# Patient Record
Sex: Male | Born: 1970 | Race: Black or African American | Hispanic: No | Marital: Married | Smoking: Never smoker
Health system: Southern US, Community
[De-identification: ages and names within clinical notes are randomized; demographics above are authoritative.]

## PROBLEM LIST (undated history)

## (undated) DIAGNOSIS — E785 Hyperlipidemia, unspecified: Secondary | ICD-10-CM

## (undated) HISTORY — DX: Hyperlipidemia, unspecified: E78.5

---

## 1998-06-04 ENCOUNTER — Emergency Department (HOSPITAL_COMMUNITY): Admission: EM | Admit: 1998-06-04 | Discharge: 1998-06-04 | Payer: Self-pay | Admitting: Emergency Medicine

## 2013-02-17 ENCOUNTER — Other Ambulatory Visit: Payer: Self-pay | Admitting: Family Medicine

## 2013-02-17 ENCOUNTER — Ambulatory Visit (INDEPENDENT_AMBULATORY_CARE_PROVIDER_SITE_OTHER): Payer: 59 | Admitting: Family Medicine

## 2013-02-17 ENCOUNTER — Ambulatory Visit: Payer: 59

## 2013-02-17 VITALS — BP 136/80 | HR 84 | Temp 98.0°F | Resp 18 | Ht 66.5 in | Wt 172.0 lb

## 2013-02-17 DIAGNOSIS — M419 Scoliosis, unspecified: Secondary | ICD-10-CM

## 2013-02-17 DIAGNOSIS — E785 Hyperlipidemia, unspecified: Secondary | ICD-10-CM

## 2013-02-17 DIAGNOSIS — M549 Dorsalgia, unspecified: Secondary | ICD-10-CM

## 2013-02-17 DIAGNOSIS — M4120 Other idiopathic scoliosis, site unspecified: Secondary | ICD-10-CM

## 2013-02-17 LAB — LIPID PANEL
Cholesterol: 234 mg/dL — ABNORMAL HIGH (ref 0–200)
HDL: 46 mg/dL (ref >39)
LDL Cholesterol: 173 mg/dL — ABNORMAL HIGH (ref 0–99)
Total CHOL/HDL Ratio: 5.1 Ratio
Triglycerides: 73 mg/dL (ref <150)
VLDL: 15 mg/dL (ref 0–40)

## 2013-02-17 LAB — TSH: TSH: 0.037 u[IU]/mL — ABNORMAL LOW (ref 0.350–4.500)

## 2013-02-17 MED ORDER — MELOXICAM 7.5 MG PO TABS: 7.5000 mg | ORAL_TABLET | Freq: Two times a day (BID) | ORAL | Status: DC | PRN

## 2013-02-17 MED ORDER — METHOCARBAMOL 500 MG PO TABS: 500.0000 mg | ORAL_TABLET | Freq: Three times a day (TID) | ORAL | Status: DC | PRN

## 2013-02-17 MED ORDER — TRAMADOL HCL 50 MG PO TABS: 50.0000 mg | ORAL_TABLET | Freq: Three times a day (TID) | ORAL | Status: DC | PRN

## 2013-02-17 NOTE — Progress Notes (Signed)
Chief Complaint:  Chief Complaint  Patient presents with  . Back Pain    x1 week     HPI: Aaron Salinas is a 42 y.o. male who is here with low back pain that's been going on since Saturday. There are no triggers, he has NKI.  The back pain  just comes and go. He can have pain when cold weather touches him. He has no numbness or tingling on lower limbs but the pain does run down to his left thigh occasionally, he is unable to give me a pain rating He has tried Alevebut  it doesn't help at all. He works in [employer], [employer] and also at [employer],  and he does a lot of lifting boxes and also cooks.  He has a hx of back pain.  When he was a child 30 yrs ago he hurt his back falling off a horse and been having trouble since with his back. He doesn't have a problem with going to the bathroom at all. He feels "really bad", he was seen here 2 years ago for the same problem. He was on Robaxin and also on naprosyn and that seemed to help at the time  He also has a history of hyperlipidemia and has been off of it because he received a note stating his cholesterol was fine, He took himself off of it after his refills ran out.  Past Medical History  Diagnosis Date  . Hyperlipidemia    History reviewed. No pertinent past surgical history. History   Social History  . Marital Status: Single    Spouse Name: N/A    Number of Children: N/A  . Years of Education: N/A   Social History Main Topics  . Smoking status: Never Smoker   . Smokeless tobacco: None  . Alcohol Use: None  . Drug Use: None  . Sexual Activity: None   Other Topics Concern  . None   Social History Narrative  . None   History reviewed. No pertinent family history. No Known Allergies Prior to Admission medications   Not on File     ROS: The patient denies fevers, chills, night sweats, unintentional weight loss, chest pain, palpitations, wheezing, dyspnea on exertion, nausea, vomiting, abdominal pain,  dysuria, hematuria, melena, numbness, weakness, or tingling.   All other systems have been reviewed and were otherwise negative with the exception of those mentioned in the HPI and as above.    PHYSICAL EXAM: Filed Vitals:   02/17/13 1057  BP: 136/80  Pulse: 84  Temp: 98 F (36.7 C)  Resp: 18   Filed Vitals:   02/17/13 1057  Height: 5' 6.5" (1.689 m)  Weight: 172 lb (78.019 kg)   Body mass index is 27.35 kg/(m^2).  General: Alert, no acute distress HEENT:  Normocephalic, atraumatic, oropharynx patent. EOMI, PERRLA Cardiovascular:  Regular rate and rhythm, no rubs murmurs or gallops.  No Carotid bruits, radial pulse intact. No pedal edema.  Respiratory: Clear to auscultation bilaterally.  No wheezes, rales, or rhonchi.  No cyanosis, no use of accessory musculature GI: No organomegaly, abdomen is soft and non-tender, positive bowel sounds.  No masses. Skin: No rashes. Neurologic: Facial musculature symmetric. Psychiatric: Patient is appropriate throughout our interaction. Lymphatic: No cervical lymphadenopathy Musculoskeletal: Gait intact. + scoliosis + paramsk tenderness  Full ROM 5/5 strength, 2/2 DTRs No saddle anesthesia Straight leg negative Knee exam--normal    LABS:   EKG/XRAY:   Primary read interpreted by Dr.  Le at [facility]. + minimal scoliosis No fx / dislocation   ASSESSMENT/PLAN: Encounter Diagnoses  Name Primary?  . Hyperlipidemia Yes  . Back pain   . Scoliosis    Rx Robaxin, tramadol, and naprosyn Refer to PT for scoliosis TSH, lipid and CMP pending  F/u in 6 months  Gross sideeffects, risk and benefits, and alternatives of medications d/w patient. Patient is aware that all medications have potential sideeffects and we are unable to predict every sideeffect or drug-drug interaction that may occur.  LE, THAO PHUONG, DO 02/18/2013 1:30 PM

## 2013-02-17 NOTE — Patient Instructions (Signed)
Scoliosis Scoliosis is the name given to a spine that curves sideways. It is a common condition found in up to ten percent of adolescents. It is more common in teenage girls. This is sometimes the result of other underlying problems such as unequal leg length or muscular problems. Approximately 70% of the time the cause unknown. It can cause twisting of the shoulders, hips, chest, back, and rib cage. Exercises generally do not affect the course of this disease, but may be helpful in strengthening weak muscle groups. Orthopedic braces may be needed during growth spurts. Surgery may be necessary for progressive cases. HOME CARE INSTRUCTIONS   Your caregiver may suggest exercises to strengthen your muscles. Follow their instructions. Ask your caregiver if you can participate in sports activities.  Bracing may be needed to try to limit the progression of the spinal curve. Wear the brace as instructed by your caregiver.  Follow-up appointments are important. Often mild cases of scoliosis can be kept track of by regular physical exams. However, periodic x-rays may be taken in more severe cases to follow the progress of the curvature, especially with brace treatment. Scoliosis can be corrected or improved if treated early. SEEK IMMEDIATE MEDICAL CARE IF:  You have back pain that is not relieved by medications prescribed by your caregiver.  If there is weakness or increased muscle tone (spasticity) in your legs or any loss of bowel or bladder control. Document Released: 02/18/2000 Document Revised: 05/15/2011 Document Reviewed: 03/09/2008 Exit[employer] Patient Information 2014 [employer].  

## 2013-02-18 ENCOUNTER — Encounter: Payer: Self-pay | Admitting: Family Medicine

## 2013-02-19 LAB — T4, FREE: Free T4: 1.13 ng/dL (ref 0.80–1.80)

## 2013-02-19 LAB — T3, FREE: T3, Free: 4 pg/mL (ref 2.3–4.2)

## 2013-02-20 ENCOUNTER — Telehealth: Payer: Self-pay | Admitting: Radiology

## 2013-02-20 MED ORDER — SIMVASTATIN 40 MG PO TABS: 40.0000 mg | ORAL_TABLET | Freq: Every day | ORAL | Status: DC

## 2013-02-20 NOTE — Telephone Encounter (Signed)
Spoke to patient about his back pain. He indicates last time he had pain was better in one day of medication, but this time is taking longer, advised him to use the medications, and give this more time.

## 2013-02-20 NOTE — Telephone Encounter (Signed)
[facility].

## 2013-02-21 ENCOUNTER — Telehealth: Payer: Self-pay | Admitting: Family Medicine

## 2013-02-21 NOTE — Telephone Encounter (Signed)
Advise pt to return in 2-3 months to get TSH and fasting lipids, CMP rechecked. His free T4 and also free T3 ( I actually wanted a total T3...) anyhow were both normal. I will recheck TSH in 3 months. He currently is asymptomatic. He will restart on zocor 40 mg daily

## 2013-04-17 DIAGNOSIS — Z0271 Encounter for disability determination: Secondary | ICD-10-CM

## 2013-06-17 ENCOUNTER — Other Ambulatory Visit: Payer: Self-pay | Admitting: Family Medicine

## 2015-10-04 IMAGING — CR DG LUMBAR SPINE COMPLETE 4+V
5 series · 5 of 5 positions shown · non-contrast
Comparison: None.

CLINICAL DATA: Fall at work.  Back pain.

EXAM:
LUMBAR SPINE - COMPLETE 4+ VIEW

[AP (1 of 2)]
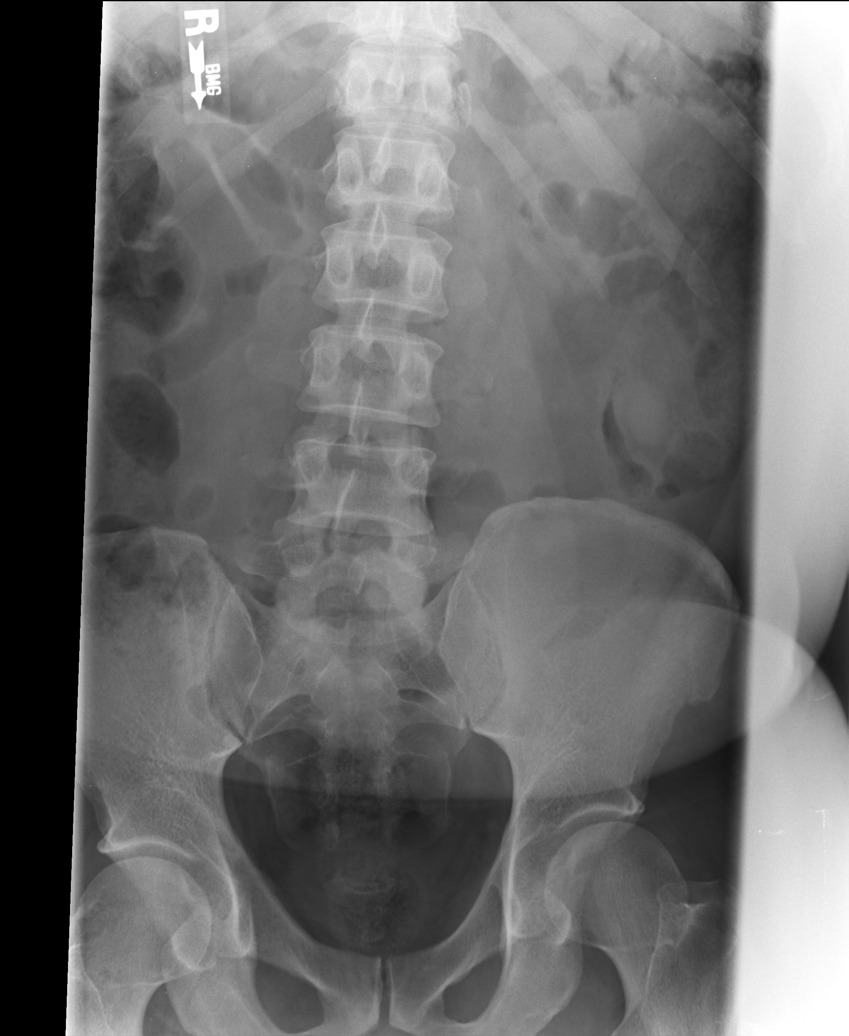

[AP (2 of 2)]
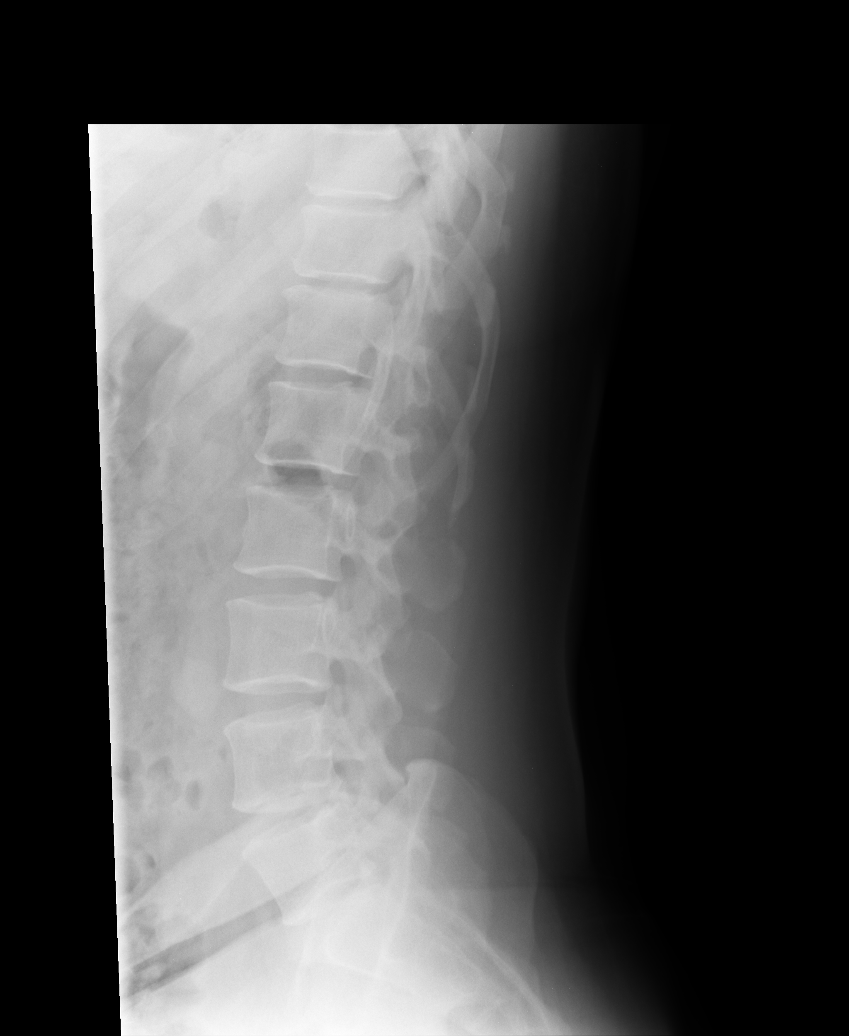

[l5 s1]
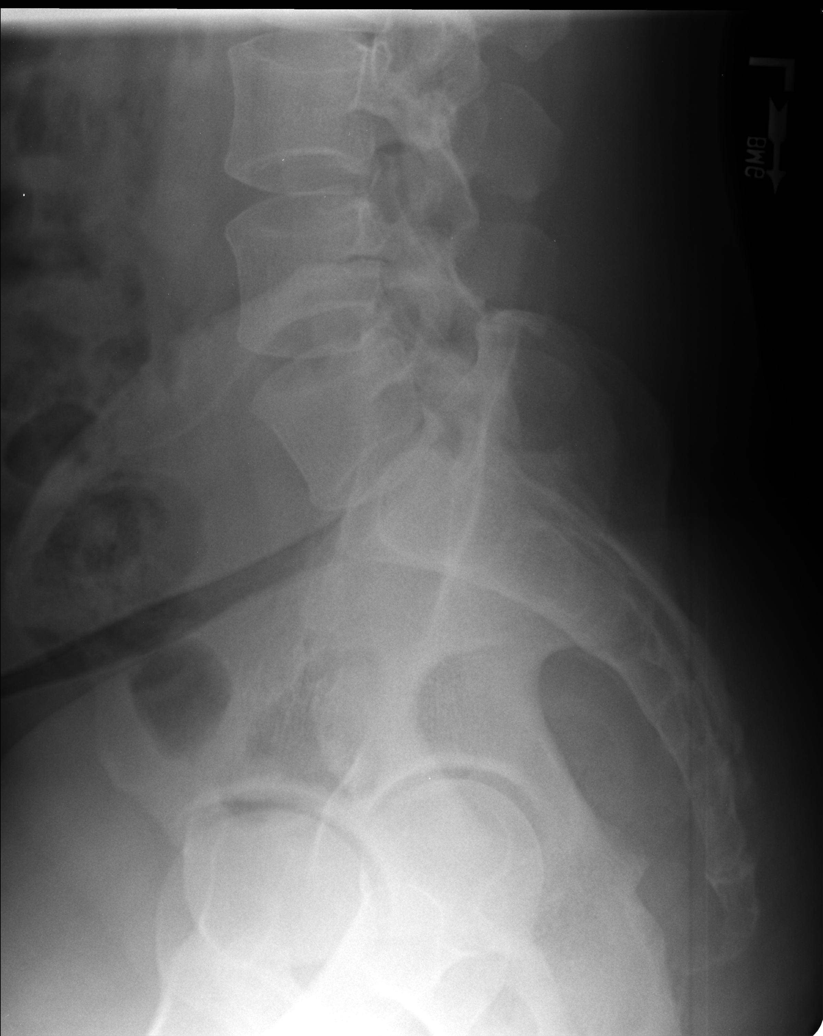

[lpo]
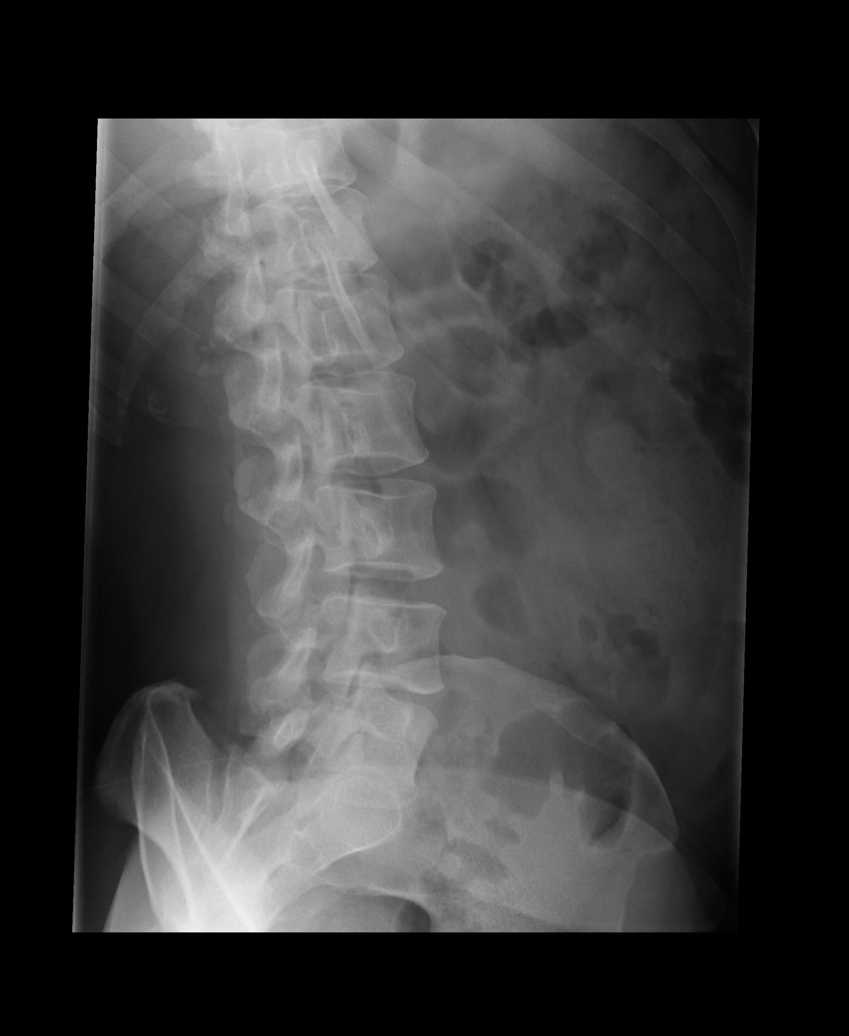

[rpo]
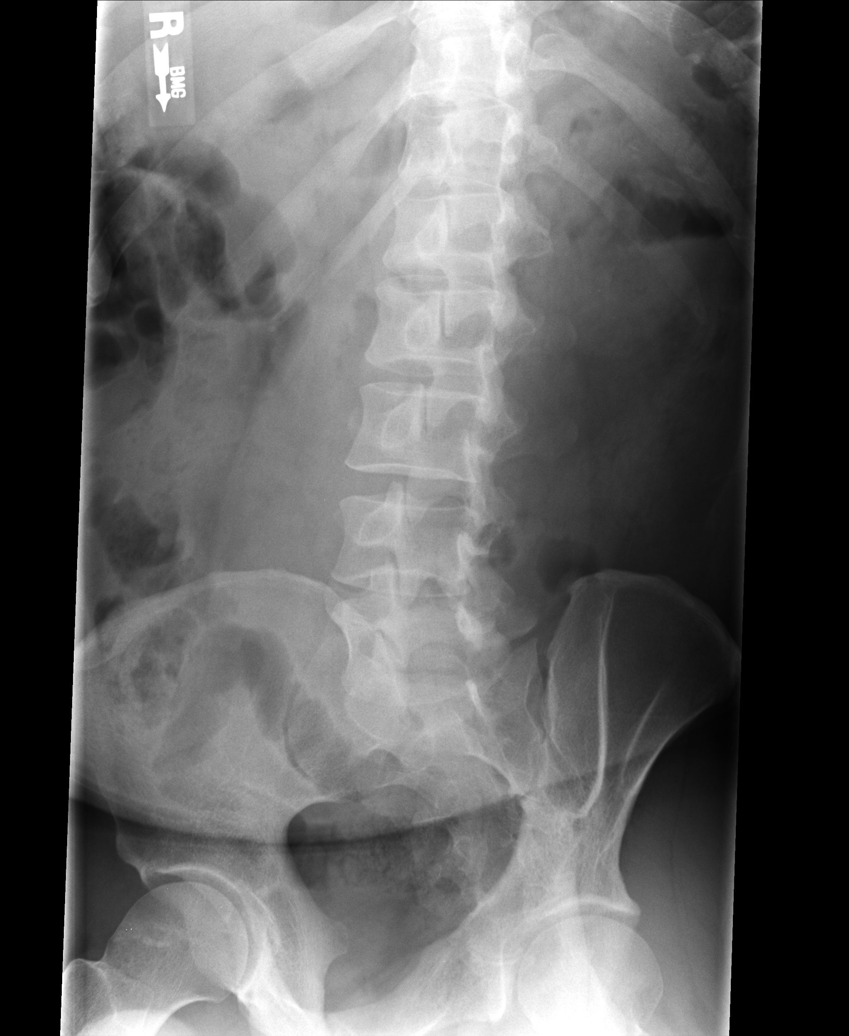

[5 of 5 positions shown; findings below may reference images not displayed]

FINDINGS: There is no evidence of lumbar spine fracture. Alignment is normal.
Intervertebral disc spaces are maintained.
IMPRESSION: Negative.

## 2016-02-12 ENCOUNTER — Ambulatory Visit (INDEPENDENT_AMBULATORY_CARE_PROVIDER_SITE_OTHER): Payer: Managed Care, Other (non HMO) | Admitting: Family Medicine

## 2016-02-12 VITALS — BP 130/80 | HR 67 | Temp 97.9°F | Resp 18 | Ht 66.5 in | Wt 180.0 lb

## 2016-02-12 DIAGNOSIS — M5441 Lumbago with sciatica, right side: Secondary | ICD-10-CM

## 2016-02-12 DIAGNOSIS — Z23 Encounter for immunization: Secondary | ICD-10-CM

## 2016-02-12 DIAGNOSIS — E785 Hyperlipidemia, unspecified: Secondary | ICD-10-CM

## 2016-02-12 MED ORDER — MELOXICAM 7.5 MG PO TABS: 7.5000 mg | ORAL_TABLET | Freq: Every day | ORAL | 0 refills | Status: AC | PRN

## 2016-02-12 MED ORDER — METHOCARBAMOL 500 MG PO TABS: 500.0000 mg | ORAL_TABLET | Freq: Three times a day (TID) | ORAL | 0 refills | Status: AC | PRN

## 2016-02-12 MED ORDER — TRAMADOL HCL 50 MG PO TABS: 50.0000 mg | ORAL_TABLET | Freq: Three times a day (TID) | ORAL | 0 refills | Status: AC | PRN

## 2016-02-12 NOTE — Patient Instructions (Addendum)
Meloxicam one per day as needed, muscle relaxant up to every 8 hours as needed. If you still need something stronger for your pain I did write for tramadol, but be careful combining that with other sedating medicines including the muscle relaxer. If your back pain is not improving within the next 1 week, or worsening sooner, return for recheck. See information for exercises below for sciatica especially as you are improving. If back pain continues to return, would recommend evaluation with orthopedist or physical therapist.   I will check your cholesterol levels, then can discuss medication if needed.  Return to the clinic or go to the nearest emergency room if any of your symptoms worsen or new symptoms occur.   Sciatica Introduction Sciatica is pain, numbness, weakness, or tingling along your sciatic nerve. The sciatic nerve starts in the lower back and goes down the back of each leg. Sciatica happens when this nerve is pinched or has pressure put on it. Sciatica usually goes away on its own or with treatment. Sometimes, sciatica may keep coming back (recur). Follow these instructions at home: Medicines  Take over-the-counter and prescription medicines only as told by your doctor.  Do not drive or use heavy machinery while taking prescription pain medicine. Managing pain  If directed, put ice on the affected area.  Put ice in a plastic bag.  Place a towel between your skin and the bag.  Leave the ice on for 20 minutes, 2-3 times a day.  After icing, apply heat to the affected area before you exercise or as often as told by your doctor. Use the heat source that your doctor tells you to use, such as a moist heat pack or a heating pad.  Place a towel between your skin and the heat source.  Leave the heat on for 20-30 minutes.  Remove the heat if your skin turns bright red. This is especially important if you are unable to feel pain, heat, or cold. You may have a greater risk of getting  burned. Activity  Return to your normal activities as told by your doctor. Ask your doctor what activities are safe for you.  Avoid activities that make your sciatica worse.  Take short rests during the day. Rest in a lying or standing position. This is usually better than sitting to rest.  When you rest for a long time, do some physical activity or stretching between periods of rest.  Avoid sitting for a long time without moving. Get up and move around at least one time each hour.  Exercise and stretch regularly, as told by your doctor.  Do not lift anything that is heavier than 10 lb (4.5 kg) while you have symptoms of sciatica.  Avoid lifting heavy things even when you do not have symptoms.  Avoid lifting heavy things over and over.  When you lift objects, always lift in a way that is safe for your body. To do this, you should:  Bend your knees.  Keep the object close to your body.  Avoid twisting. General instructions  Use good posture.  Avoid leaning forward when you are sitting.  Avoid hunching over when you are standing.  Stay at a healthy weight.  Wear comfortable shoes that support your feet. Avoid wearing high heels.  Avoid sleeping on a mattress that is too soft or too hard. You might have less pain if you sleep on a mattress that is firm enough to support your back.  Keep all follow-up visits as  told by your doctor. This is important. Contact a doctor if:  You have pain that:  Wakes you up when you are sleeping.  Gets worse when you lie down.  Is worse than the pain you have had in the past.  Lasts longer than 4 weeks.  You lose weight for without trying. Get help right away if:  You cannot control when you pee (urinate) or poop (have a bowel movement).  You have weakness in any of these areas and it gets worse.  Lower back.  Lower belly (pelvis).  Butt (buttocks).  Legs.  You have redness or swelling of your back.  You have a  burning feeling when you pee. This information is not intended to replace advice given to you by your health care provider. Make sure you discuss any questions you have with your health care provider. Document Released: 11/30/2007 Document Revised: 07/29/2015 Document Reviewed: 10/30/2014  2017 Elsevier   Sciatica Rehab Ask your health care provider which exercises are safe for you. Do exercises exactly as told by your health care provider and adjust them as directed. It is normal to feel mild stretching, pulling, tightness, or discomfort as you do these exercises, but you should stop right away if you feel sudden pain or your pain gets worse.Do not begin these exercises until told by your health care provider. Stretching and range of motion exercises These exercises warm up your muscles and joints and improve the movement and flexibility of your hips and your back. These exercises also help to relieve pain, numbness, and tingling. Exercise A: Sciatic nerve glide 1. Sit in a chair with your head facing down toward your chest. Place your hands behind your back. Let your shoulders slump forward. 2. Slowly straighten one of your knees while you tilt your head back as if you are looking toward the ceiling. Only straighten your leg as far as you can without making your symptoms worse. 3. Hold for __________ seconds. 4. Slowly return to the starting position. 5. Repeat with your other leg. Repeat __________ times. Complete this exercise __________ times a day. Exercise B: Knee to chest with hip adduction and internal rotation 1. Lie on your back on a firm surface with both legs straight. 2. Bend one of your knees and move it up toward your chest until you feel a gentle stretch in your lower back and buttock. Then, move your knee toward the shoulder that is on the opposite side from your leg.  Hold your leg in this position by holding onto the front of your knee. 3. Hold for __________  seconds. 4. Slowly return to the starting position. 5. Repeat with your other leg. Repeat __________ times. Complete this exercise __________ times a day. Exercise C: Prone extension on elbows 1. Lie on your abdomen on a firm surface. A bed may be too soft for this exercise. 2. Prop yourself up on your elbows. 3. Use your arms to help lift your chest up until you feel a gentle stretch in your abdomen and your lower back.  This will place some of your body weight on your elbows. If this is uncomfortable, try stacking pillows under your chest.  Your hips should stay down, against the surface that you are lying on. Keep your hip and back muscles relaxed. 4. Hold for __________ seconds. 5. Slowly relax your upper body and return to the starting position. Repeat __________ times. Complete this exercise __________ times a day. Strengthening exercises These exercises build strength  and endurance in your back. Endurance is the ability to use your muscles for a long time, even after they get tired. Exercise D: Pelvic tilt 1. Lie on your back on a firm surface. Bend your knees and keep your feet flat. 2. Tense your abdominal muscles. Tip your pelvis up toward the ceiling and flatten your lower back into the floor.  To help with this exercise, you may place a small towel under your lower back and try to push your back into the towel. 3. Hold for __________ seconds. 4. Let your muscles relax completely before you repeat this exercise. Repeat __________ times. Complete this exercise __________ times a day. Exercise E: Alternating arm and leg raises 1. Get on your hands and knees on a firm surface. If you are on a hard floor, you may want to use padding to cushion your knees, such as an exercise mat. 2. Line up your arms and legs. Your hands should be below your shoulders, and your knees should be below your hips. 3. Lift your left leg behind you. At the same time, raise your right arm and straighten  it in front of you.  Do not lift your leg higher than your hip.  Do not lift your arm higher than your shoulder.  Keep your abdominal and back muscles tight.  Keep your hips facing the ground.  Do not arch your back.  Keep your balance carefully, and do not hold your breath. 4. Hold for __________ seconds. 5. Slowly return to the starting position and repeat with your right leg and your left arm. Repeat __________ times. Complete this exercise __________ times a day. Posture and body mechanics   Body mechanics refers to the movements and positions of your body while you do your daily activities. Posture is part of body mechanics. Good posture and healthy body mechanics can help to relieve stress in your body's tissues and joints. Good posture means that your spine is in its natural S-curve position (your spine is neutral), your shoulders are pulled back slightly, and your head is not tipped forward. The following are general guidelines for applying improved posture and body mechanics to your everyday activities. Standing   When standing, keep your spine neutral and your feet about hip-width apart. Keep a slight bend in your knees. Your ears, shoulders, and hips should line up.  When you do a task in which you stand in one place for a long time, place one foot up on a stable object that is 2-4 inches (5-10 cm) high, such as a footstool. This helps keep your spine neutral. Sitting  When sitting, keep your spine neutral and keep your feet flat on the floor. Use a footrest, if necessary, and keep your thighs parallel to the floor. Avoid rounding your shoulders, and avoid tilting your head forward.  When working at a desk or a computer, keep your desk at a height where your hands are slightly lower than your elbows. Slide your chair under your desk so you are close enough to maintain good posture.  When working at a computer, place your monitor at a height where you are looking straight  ahead and you do not have to tilt your head forward or downward to look at the screen. Resting   When lying down and resting, avoid positions that are most painful for you.  If you have pain with activities such as sitting, bending, stooping, or squatting (flexion-based activities), lie in a position in which your body  does not bend very much. For example, avoid curling up on your side with your arms and knees near your chest (fetal position).  If you have pain with activities such as standing for a long time or reaching with your arms (extension-based activities), lie with your spine in a neutral position and bend your knees slightly. Try the following positions:  Lying on your side with a pillow between your knees.  Lying on your back with a pillow under your knees. Lifting   When lifting objects, keep your feet at least shoulder-width apart and tighten your abdominal muscles.  Bend your knees and hips and keep your spine neutral. It is important to lift using the strength of your legs, not your back. Do not lock your knees straight out.  Always ask for help to lift heavy or awkward objects. This information is not intended to replace advice given to you by your health care provider. Make sure you discuss any questions you have with your health care provider. Document Released: 02/20/2005 Document Revised: 10/28/2015 Document Reviewed: 11/06/2014 Elsevier Interactive Patient Education  2017 [employer]   IF you received an x-ray today, you will receive an invoice from [facility]. Please contact [facility] at [phone] with questions or concerns regarding your invoice.   IF you received labwork today, you will receive an invoice from [facility]. Please contact [facility] at [phone] with questions or concerns regarding your invoice.   Our billing staff will not be able to assist you with questions regarding bills from these  companies.  You will be contacted with the lab results as soon as they are available. The fastest way to get your results is to activate your My Chart account. Instructions are located on the last page of this paperwork. If you have not heard from us regarding the results in 2 weeks, please contact this office.

## 2016-02-12 NOTE — Progress Notes (Addendum)
Subjective:  By signing my name below, I, Aaron Salinas, attest that this documentation has been prepared under the direction and in the presence of Meredith StaggersJeffrey Ravynn Hogate, MD. Electronically Signed: Stann Oresung-Kai Salinas, Scribe. 02/12/2016 , 12:13 PM .  Patient was seen in Room 11 .   Patient ID: Aaron Salinas Angelini, male    DOB: 03/26/1970, 45 y.o.   MRN: [id number] Chief Complaint  Patient presents with  . Hip Pain    RIGHT  . Leg Pain    RIGHT   HPI Aaron Salinas is a 45 y.o. male Here for right hip and leg pain. Patient states he woke up with pain in his right side low back, radiates down to his right hip, and down his right leg to his foot. He tried to go to work but had to sit down every 10 minutes.   He denies taking any medications, or applying any ice or heat to the area. He denies any recent falls or injuries prior. He denies having any pain yesterday. He denies any urinary symptoms, saddle anesthesia, or urinary incontinence. 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   He has had lumbar spine xray in Dec 2014, when seen by Dr. Conley RollsLe, which showed mild scoliotic curvature, possible positional. He has similar symptoms but that was due to work. He was instructed to physical therapy previously for some relief. He was treated with robaxin, meloxicam, and tramadol.   He reported then, that he fell off a horse about 30 years ago. He'Salinas been having intermittent back pain, about 2~3 times a month. He feels it but continue to work and exercise for improvement.   Hyperlipidemia He also wants to check his cholesterol. He denies having a PCP.  Lab Results  Component Value Date   CHOL 234 (H) 02/17/2013   HDL 46 02/17/2013   LDLCALC 173 (H) 02/17/2013   TRIG 73 02/17/2013   CHOLHDL 5.1 02/17/2013   No results found for: ALT, AST, GGT, ALKPHOS, BILITOT  He'Salinas on zocor qd. He was given 6 months worth in Dec 2014.   There are no active problems to display for this patient.  Past Medical History:    Diagnosis Date  . Hyperlipidemia    History reviewed. No pertinent surgical history. No Known Allergies Prior to Admission medications   Medication Sig Start Date End Date Taking? Authorizing Provider  meloxicam (MOBIC) 7.5 MG tablet TAKE ONE TABLET BY MOUTH TWICE DAILY AS NEEDED FOR PAIN Patient not taking: Reported on 02/12/2016    Thao P Le, DO  methocarbamol (ROBAXIN) 500 MG tablet Take 1 tablet (500 mg total) by mouth every 8 (eight) hours as needed for muscle spasms. Patient not taking: Reported on 02/12/2016 02/17/13   Thao P Le, DO  simvastatin (ZOCOR) 40 MG tablet Take 1 tablet (40 mg total) by mouth at bedtime. Patient not taking: Reported on 02/12/2016 02/20/13   Thao P Le, DO  traMADol (ULTRAM) 50 MG tablet Take 1 tablet (50 mg total) by mouth every 8 (eight) hours as needed. Patient not taking: Reported on 02/12/2016 02/17/13   Lenell Antuhao P Le, DO   Social History   Social History  . Marital status: Single    Spouse name: N/A  . Number of children: N/A  . Years of education: N/A   Occupational History  . Not on file.   Social History Main Topics  . Smoking status: Never Smoker  . Smokeless tobacco: Never Used  . Alcohol use No  .  Drug use: No  . Sexual activity: Not on file   Other Topics Concern  . Not on file   Social History Narrative  . No narrative on file   Review of Systems  Constitutional: Negative for fatigue and unexpected weight change.  Eyes: Negative for visual disturbance.  Respiratory: Negative for cough, chest tightness and shortness of breath.   Cardiovascular: Negative for chest pain, palpitations and leg swelling.  Gastrointestinal: Negative for abdominal pain and blood in stool.  Genitourinary: Negative for difficulty urinating.  Musculoskeletal: Positive for arthralgias, back pain, gait problem and myalgias. Negative for joint swelling.  Neurological: Negative for dizziness, weakness, light-headedness, numbness and headaches.        Objective:   Physical Exam  Constitutional: He is oriented to person, place, and time. He appears well-developed and well-nourished. No distress.  HENT:  Head: Normocephalic and atraumatic.  Eyes: EOM are normal. Pupils are equal, round, and reactive to light.  Neck: Neck supple.  Cardiovascular: Normal rate.   Pulmonary/Chest: Effort normal. No respiratory distress.  Musculoskeletal: Normal range of motion.  Negative seated straight leg raise on right, tender along right paraspinals along lower lumbar spine, no bony tenderness, L-spine flexion to 90 degrees, extension intact but painful, equal lateral flexion and lateral rotation, able to heel-toe walk without difficulty  Neurological: He is alert and oriented to person, place, and time. He displays no Babinski'Salinas sign on the right side. He displays no Babinski'Salinas sign on the left side.  Reflex Scores:      Patellar reflexes are 2+ on the right side and 2+ on the left side.      Achilles reflexes are 2+ on the right side and 2+ on the left side. Skin: Skin is warm and dry.  Psychiatric: He has a normal mood and affect. His behavior is normal.  Nursing note and vitals reviewed.   Vitals:   02/12/16 1109  BP: 130/80  Pulse: 67  Resp: 18  Temp: 97.9 F (36.6 C)  TempSrc: Oral  SpO2: 98%  Weight: 180 lb (81.6 kg)  Height: 5' 6.5" (1.689 m)      Assessment & Plan:    Aaron Salinas Heiney is a 45 y.o. male Acute bilateral low back pain with right-sided sciatica - Plan: traMADol (ULTRAM) 50 MG tablet, methocarbamol (ROBAXIN) 500 MG tablet, meloxicam (MOBIC) 7.5 MG tablet  -Suspected sciatica with likely lumbar source. No known injury, no red flags on exam or history. Imaging deferred at present visit.   -Initial trial of meloxicam 7.5 mg daily, Robaxin, and Ultram if needed for more severe breakthrough pain. Side effects discussed and Combined sedation precautions given.  -Home exercises given as improving. Consider ortho eval/formal PT  if recurrent/persistent.  Need for prophylactic vaccination and inoculation against influenza - Plan: Flu Vaccine QUAD 36+ mos IM  Flu vaccine given  Hyperlipidemia, unspecified hyperlipidemia type - Plan: Comprehensive metabolic panel, Lipid panel  -Check CMP, lipid panel and can decide on medications when results return.  Meds ordered this encounter  Medications  . traMADol (ULTRAM) 50 MG tablet    Sig: Take 1 tablet (50 mg total) by mouth every 8 (eight) hours as needed.    Dispense:  15 tablet    Refill:  0  . methocarbamol (ROBAXIN) 500 MG tablet    Sig: Take 1 tablet (500 mg total) by mouth every 8 (eight) hours as needed for muscle spasms.    Dispense:  30 tablet    Refill:  0  .  meloxicam (MOBIC) 7.5 MG tablet    Sig: Take 1 tablet (7.5 mg total) by mouth daily as needed. for pain    Dispense:  30 tablet    Refill:  0   Patient Instructions   Meloxicam one per day as needed, muscle relaxant up to every 8 hours as needed. If you still need something stronger for your pain I did write for tramadol, but be careful combining that with other sedating medicines including the muscle relaxer. If your back pain is not improving within the next 1 week, or worsening sooner, return for recheck. See information for exercises below for sciatica especially as you are improving. If back pain continues to return, would recommend evaluation with orthopedist or physical therapist.   I will check your cholesterol levels, then can discuss medication if needed.  Return to the clinic or go to the nearest emergency room if any of your symptoms worsen or new symptoms occur.   Sciatica Introduction Sciatica is pain, numbness, weakness, or tingling along your sciatic nerve. The sciatic nerve starts in the lower back and goes down the back of each leg. Sciatica happens when this nerve is pinched or has pressure put on it. Sciatica usually goes away on its own or with treatment. Sometimes, sciatica may  keep coming back (recur). Follow these instructions at home: Medicines  Take over-the-counter and prescription medicines only as told by your doctor.  Do not drive or use heavy machinery while taking prescription pain medicine. Managing pain  If directed, put ice on the affected area.  Put ice in a plastic bag.  Place a towel between your skin and the bag.  Leave the ice on for 20 minutes, 2-3 times a day.  After icing, apply heat to the affected area before you exercise or as often as told by your doctor. Use the heat source that your doctor tells you to use, such as a moist heat pack or a heating pad.  Place a towel between your skin and the heat source.  Leave the heat on for 20-30 minutes.  Remove the heat if your skin turns bright red. This is especially important if you are unable to feel pain, heat, or cold. You may have a greater risk of getting burned. Activity  Return to your normal activities as told by your doctor. Ask your doctor what activities are safe for you.  Avoid activities that make your sciatica worse.  Take short rests during the day. Rest in a lying or standing position. This is usually better than sitting to rest.  When you rest for a long time, do some physical activity or stretching between periods of rest.  Avoid sitting for a long time without moving. Get up and move around at least one time each hour.  Exercise and stretch regularly, as told by your doctor.  Do not lift anything that is heavier than 10 lb (4.5 kg) while you have symptoms of sciatica.  Avoid lifting heavy things even when you do not have symptoms.  Avoid lifting heavy things over and over.  When you lift objects, always lift in a way that is safe for your body. To do this, you should:  Bend your knees.  Keep the object close to your body.  Avoid twisting. General instructions  Use good posture.  Avoid leaning forward when you are sitting.  Avoid hunching over when  you are standing.  Stay at a healthy weight.  Wear comfortable shoes that support your feet.  Avoid wearing high heels.  Avoid sleeping on a mattress that is too soft or too hard. You might have less pain if you sleep on a mattress that is firm enough to support your back.  Keep all follow-up visits as told by your doctor. This is important. Contact a doctor if:  You have pain that:  Wakes you up when you are sleeping.  Gets worse when you lie down.  Is worse than the pain you have had in the past.  Lasts longer than 4 weeks.  You lose weight for without trying. Get help right away if:  You cannot control when you pee (urinate) or poop (have a bowel movement).  You have weakness in any of these areas and it gets worse.  Lower back.  Lower belly (pelvis).  Butt (buttocks).  Legs.  You have redness or swelling of your back.  You have a burning feeling when you pee. This information is not intended to replace advice given to you by your health care provider. Make sure you discuss any questions you have with your health care provider. Document Released: 11/30/2007 Document Revised: 07/29/2015 Document Reviewed: 10/30/2014  2017 Elsevier   Sciatica Rehab Ask your health care provider which exercises are safe for you. Do exercises exactly as told by your health care provider and adjust them as directed. It is normal to feel mild stretching, pulling, tightness, or discomfort as you do these exercises, but you should stop right away if you feel sudden pain or your pain gets worse.Do not begin these exercises until told by your health care provider. Stretching and range of motion exercises These exercises warm up your muscles and joints and improve the movement and flexibility of your hips and your back. These exercises also help to relieve pain, numbness, and tingling. Exercise A: Sciatic nerve glide 1. Sit in a chair with your head facing down toward your chest. Place your  hands behind your back. Let your shoulders slump forward. 2. Slowly straighten one of your knees while you tilt your head back as if you are looking toward the ceiling. Only straighten your leg as far as you can without making your symptoms worse. 3. Hold for __________ seconds. 4. Slowly return to the starting position. 5. Repeat with your other leg. Repeat __________ times. Complete this exercise __________ times a day. Exercise B: Knee to chest with hip adduction and internal rotation 1. Lie on your back on a firm surface with both legs straight. 2. Bend one of your knees and move it up toward your chest until you feel a gentle stretch in your lower back and buttock. Then, move your knee toward the shoulder that is on the opposite side from your leg.  Hold your leg in this position by holding onto the front of your knee. 3. Hold for __________ seconds. 4. Slowly return to the starting position. 5. Repeat with your other leg. Repeat __________ times. Complete this exercise __________ times a day. Exercise C: Prone extension on elbows 1. Lie on your abdomen on a firm surface. A bed may be too soft for this exercise. 2. Prop yourself up on your elbows. 3. Use your arms to help lift your chest up until you feel a gentle stretch in your abdomen and your lower back.  This will place some of your body weight on your elbows. If this is uncomfortable, try stacking pillows under your chest.  Your hips should stay down, against the surface that you are lying  on. Keep your hip and back muscles relaxed. 4. Hold for __________ seconds. 5. Slowly relax your upper body and return to the starting position. Repeat __________ times. Complete this exercise __________ times a day. Strengthening exercises These exercises build strength and endurance in your back. Endurance is the ability to use your muscles for a long time, even after they get tired. Exercise D: Pelvic tilt 1. Lie on your back on a firm  surface. Bend your knees and keep your feet flat. 2. Tense your abdominal muscles. Tip your pelvis up toward the ceiling and flatten your lower back into the floor.  To help with this exercise, you may place a small towel under your lower back and try to push your back into the towel. 3. Hold for __________ seconds. 4. Let your muscles relax completely before you repeat this exercise. Repeat __________ times. Complete this exercise __________ times a day. Exercise E: Alternating arm and leg raises 1. Get on your hands and knees on a firm surface. If you are on a hard floor, you may want to use padding to cushion your knees, such as an exercise mat. 2. Line up your arms and legs. Your hands should be below your shoulders, and your knees should be below your hips. 3. Lift your left leg behind you. At the same time, raise your right arm and straighten it in front of you.  Do not lift your leg higher than your hip.  Do not lift your arm higher than your shoulder.  Keep your abdominal and back muscles tight.  Keep your hips facing the ground.  Do not arch your back.  Keep your balance carefully, and do not hold your breath. 4. Hold for __________ seconds. 5. Slowly return to the starting position and repeat with your right leg and your left arm. Repeat __________ times. Complete this exercise __________ times a day. Posture and body mechanics   Body mechanics refers to the movements and positions of your body while you do your daily activities. Posture is part of body mechanics. Good posture and healthy body mechanics can help to relieve stress in your body'Salinas tissues and joints. Good posture means that your spine is in its natural Salinas-curve position (your spine is neutral), your shoulders are pulled back slightly, and your head is not tipped forward. The following are general guidelines for applying improved posture and body mechanics to your everyday activities. Standing   When standing,  keep your spine neutral and your feet about hip-width apart. Keep a slight bend in your knees. Your ears, shoulders, and hips should line up.  When you do a task in which you stand in one place for a long time, place one foot up on a stable object that is 2-4 inches (5-10 cm) high, such as a footstool. This helps keep your spine neutral. Sitting  When sitting, keep your spine neutral and keep your feet flat on the floor. Use a footrest, if necessary, and keep your thighs parallel to the floor. Avoid rounding your shoulders, and avoid tilting your head forward.  When working at a desk or a computer, keep your desk at a height where your hands are slightly lower than your elbows. Slide your chair under your desk so you are close enough to maintain good posture.  When working at a computer, place your monitor at a height where you are looking straight ahead and you do not have to tilt your head forward or downward to look at the  screen. Resting   When lying down and resting, avoid positions that are most painful for you.  If you have pain with activities such as sitting, bending, stooping, or squatting (flexion-based activities), lie in a position in which your body does not bend very much. For example, avoid curling up on your side with your arms and knees near your chest (fetal position).  If you have pain with activities such as standing for a long time or reaching with your arms (extension-based activities), lie with your spine in a neutral position and bend your knees slightly. Try the following positions:  Lying on your side with a pillow between your knees.  Lying on your back with a pillow under your knees. Lifting   When lifting objects, keep your feet at least shoulder-width apart and tighten your abdominal muscles.  Bend your knees and hips and keep your spine neutral. It is important to lift using the strength of your legs, not your back. Do not lock your knees straight  out.  Always ask for help to lift heavy or awkward objects. This information is not intended to replace advice given to you by your health care provider. Make sure you discuss any questions you have with your health care provider. Document Released: 02/20/2005 Document Revised: 10/28/2015 Document Reviewed: 11/06/2014 Elsevier Interactive Patient Education  2017 [employer].   IF you received an x-ray today, you will receive an invoice from [facility]. Please contact [facility] at [phone] with questions or concerns regarding your invoice.   IF you received labwork today, you will receive an invoice from [employer]. Please contact [facility] at [phone] with questions or concerns regarding your invoice.   Our billing staff will not be able to assist you with questions regarding bills from these companies.  You will be contacted with the lab results as soon as they are available. The fastest way to get your results is to activate your My Chart account. Instructions are located on the last page of this paperwork. If you have not heard from [place] regarding the results in 2 weeks, please contact this office.       I personally performed the services described in this documentation, which was scribed in my presence. The recorded information has been reviewed and considered, and addended by me as needed.   Signed,   Meredith Staggers, MD [facility].  02/13/16 5:26 PM

## 2016-02-13 LAB — COMPREHENSIVE METABOLIC PANEL
ALT: 40 IU/L (ref 0–44)
AST: 46 IU/L — ABNORMAL HIGH (ref 0–40)
Albumin/Globulin Ratio: 1.8 (ref 1.2–2.2)
Albumin: 4.6 g/dL (ref 3.5–5.5)
Alkaline Phosphatase: 65 IU/L (ref 39–117)
BILIRUBIN TOTAL: 0.4 mg/dL (ref 0.0–1.2)
BUN/Creatinine Ratio: 11 (ref 9–20)
BUN: 10 mg/dL (ref 6–24)
CHLORIDE: 102 mmol/L (ref 96–106)
CO2: 25 mmol/L (ref 18–29)
Calcium: 9.9 mg/dL (ref 8.7–10.2)
Creatinine, Ser: 0.91 mg/dL (ref 0.76–1.27)
GFR, EST AFRICAN AMERICAN: 116 mL/min/{1.73_m2} (ref >59)
GFR, EST NON AFRICAN AMERICAN: 100 mL/min/{1.73_m2} (ref >59)
Globulin, Total: 2.6 g/dL (ref 1.5–4.5)
Glucose: 95 mg/dL (ref 65–99)
Potassium: 4.7 mmol/L (ref 3.5–5.2)
Sodium: 142 mmol/L (ref 134–144)
TOTAL PROTEIN: 7.2 g/dL (ref 6.0–8.5)

## 2016-02-13 LAB — LIPID PANEL
CHOL/HDL RATIO: 5.1 ratio — AB (ref 0.0–5.0)
Cholesterol, Total: 263 mg/dL — ABNORMAL HIGH (ref 100–199)
HDL: 52 mg/dL (ref >39)
LDL Calculated: 180 mg/dL — ABNORMAL HIGH (ref 0–99)
Triglycerides: 153 mg/dL — ABNORMAL HIGH (ref 0–149)
VLDL CHOLESTEROL CAL: 31 mg/dL (ref 5–40)

## 2016-02-15 ENCOUNTER — Telehealth: Payer: Self-pay

## 2016-02-15 NOTE — Telephone Encounter (Signed)
PATIENT WAS IN THE OFFICE ON Saturday AND HE SAW DR. Neva SeatGREENE FOR HIS BACK. HE WOULD LIKE TO GET HIS LAB RESULTS PLEASE. BEST PHONE [phone] (CELL)  PHARMACY CHOICE IS [facility] ON [address] ([place]) [place]

## 2016-02-22 NOTE — Telephone Encounter (Signed)
Routed to lab pool. It appears results are released on Mychart. Please review.

## 2016-02-23 ENCOUNTER — Telehealth: Payer: Self-pay

## 2016-02-23 NOTE — Telephone Encounter (Signed)
Mychart message sent to patient.

## 2016-02-23 NOTE — Telephone Encounter (Signed)
Please advise 

## 2016-02-23 NOTE — Telephone Encounter (Signed)
Pt calling in regards to lab results he was reviewing them and saw his cholesterol was high and wondered if he needed cholesterol medication to bring those levels down   Please advise  [phone]

## 2016-03-10 ENCOUNTER — Encounter: Payer: Self-pay | Admitting: Family Medicine

## 2016-03-10 DIAGNOSIS — E782 Mixed hyperlipidemia: Secondary | ICD-10-CM

## 2016-03-11 MED ORDER — SIMVASTATIN 40 MG PO TABS
40.0000 mg | ORAL_TABLET | Freq: Every day | ORAL | 1 refills | Status: AC
Start: 2016-03-11 — End: ?

## 2016-03-11 NOTE — Telephone Encounter (Signed)
zocor refilled, future labs ordered (lft's, lipid panel).

## 2019-01-24 ENCOUNTER — Other Ambulatory Visit: Payer: Self-pay

## 2019-01-24 ENCOUNTER — Emergency Department (HOSPITAL_COMMUNITY)
Admission: EM | Admit: 2019-01-24 | Discharge: 2019-01-24 | Disposition: A | Discharge status: Home / Self Care | Payer: No Typology Code available for payment source | Attending: Emergency Medicine | Admitting: Emergency Medicine

## 2019-01-24 ENCOUNTER — Encounter (HOSPITAL_COMMUNITY): Payer: Self-pay

## 2019-01-24 DIAGNOSIS — Y990 Civilian activity done for income or pay: Secondary | ICD-10-CM | POA: Diagnosis not present

## 2019-01-24 DIAGNOSIS — S91312A Laceration without foreign body, left foot, initial encounter: Secondary | ICD-10-CM | POA: Insufficient documentation

## 2019-01-24 DIAGNOSIS — W260XXA Contact with knife, initial encounter: Secondary | ICD-10-CM | POA: Diagnosis not present

## 2019-01-24 DIAGNOSIS — S99922A Unspecified injury of left foot, initial encounter: Secondary | ICD-10-CM | POA: Diagnosis present

## 2019-01-24 DIAGNOSIS — Y929 Unspecified place or not applicable: Secondary | ICD-10-CM | POA: Insufficient documentation

## 2019-01-24 DIAGNOSIS — Z23 Encounter for immunization: Secondary | ICD-10-CM | POA: Insufficient documentation

## 2019-01-24 DIAGNOSIS — Y939 Activity, unspecified: Secondary | ICD-10-CM | POA: Insufficient documentation

## 2019-01-24 DIAGNOSIS — Z79899 Other long term (current) drug therapy: Secondary | ICD-10-CM | POA: Diagnosis not present

## 2019-01-24 DIAGNOSIS — Z789 Other specified health status: Secondary | ICD-10-CM

## 2019-01-24 MED ORDER — TETANUS-DIPHTH-ACELL PERTUSSIS 5-2.5-18.5 LF-MCG/0.5 IM SUSP
0.5000 mL | Freq: Once | INTRAMUSCULAR | Status: AC
Start: 2019-01-24 — End: 2019-01-24
  Administered 2019-01-24: 11:00:00 0.5 mL via INTRAMUSCULAR
  Filled 2019-01-24: qty 0.5

## 2019-01-24 NOTE — Discharge Instructions (Signed)
Keep wound clean and dry for the next 48 hours. Follow up with PCP for wound check in 2-3 days. Return for new or worsening symptoms.

## 2019-01-24 NOTE — ED Triage Notes (Signed)
Pt states that he was preparing food today at work and dropped a knife on his left foot. Unsure of last tetanus.

## 2019-01-24 NOTE — ED Notes (Signed)
Wound cleaned and dressing applied. Discharge instructions reviewed and patient verbalizes understanding.

## 2019-01-24 NOTE — ED Provider Notes (Signed)
[facility]-EMERGENCY DEPT Provider Note   CSN: [id number] Arrival date & time: 01/24/19  1005    History   Chief Complaint Chief Complaint  Patient presents with   Laceration   HPI Aaron Salinas is a 48 y.o. male with past medical history significant for hyperlipidemia who presents for evaluation of laceration. Patient states just PTA he dropped a metal knife which cut the medial aspect of his left foot. States would was initially bleeding. He placed pressure over the wound and the bleeding stopped. Unknown last tetanus. He has been ambulatory since the incident. Denies any pain. No anticoagulation. He washed the wound out PTA with soap and water. Denies additional aggravating or alleviating factors. Denies fever, chills, N/V, decreased ROM, bleeding, drainage, paresthesias, redness, warmth, swelling to extremities.    History obtained from patient and past medical records. No interpretor was used.     HPI  Past Medical History:  Diagnosis Date   Hyperlipidemia     There are no active problems to display for this patient.   History reviewed. No pertinent surgical history.      Home Medications    Prior to Admission medications   Medication Sig Start Date End Date Taking? Authorizing Provider  meloxicam (MOBIC) 7.5 MG tablet Take 1 tablet (7.5 mg total) by mouth daily as needed. for pain 02/12/16   Shade Flood, MD  methocarbamol (ROBAXIN) 500 MG tablet Take 1 tablet (500 mg total) by mouth every 8 (eight) hours as needed for muscle spasms. 02/12/16   Shade Flood, MD  simvastatin (ZOCOR) 40 MG tablet Take 1 tablet (40 mg total) by mouth daily at 6 PM. 03/11/16   Shade Flood, MD  traMADol (ULTRAM) 50 MG tablet Take 1 tablet (50 mg total) by mouth every 8 (eight) hours as needed. 02/12/16   Shade Flood, MD    Family History No family history on file.  Social History Social History   Tobacco Use   Smoking status: Never  Smoker   Smokeless tobacco: Never Used  Substance Use Topics   Alcohol use: No   Drug use: No     Allergies   Patient has no known allergies.   Review of Systems Review of Systems  Constitutional: Negative.   HENT: Negative.   Respiratory: Negative.   Cardiovascular: Negative.   Gastrointestinal: Negative.   Genitourinary: Negative.   Musculoskeletal: Negative.   Skin: Positive for wound.  Neurological: Negative.   All other systems reviewed and are negative.  Physical Exam Updated Vital Signs BP (!) 163/99    Pulse 100    Temp 98.2 F (36.8 C) (Oral)    Resp 16    SpO2 98%   Physical Exam Vitals signs and nursing note reviewed.  Constitutional:      General: He is not in acute distress.    Appearance: He is well-developed. He is not ill-appearing, toxic-appearing or diaphoretic.  HENT:     Head: Normocephalic and atraumatic.     Nose: Nose normal.     Mouth/Throat:     Mouth: Mucous membranes are moist.     Pharynx: Oropharynx is clear.  Eyes:     Pupils: Pupils are equal, round, and reactive to light.  Neck:     Musculoskeletal: Normal range of motion and neck supple.  Cardiovascular:     Rate and Rhythm: Normal rate and regular rhythm.     Pulses: Normal pulses.  Dorsalis pedis pulses are 2+ on the right side and 2+ on the left side.       Posterior tibial pulses are 2+ on the right side and 2+ on the left side.     Heart sounds: Normal heart sounds.  Pulmonary:     Effort: Pulmonary effort is normal. No respiratory distress.     Breath sounds: Normal breath sounds.  Abdominal:     General: Bowel sounds are normal. There is no distension.     Palpations: Abdomen is soft.  Musculoskeletal: Normal range of motion.     Comments: Moves all 4 extremities without difficulty. Compartments soft.  Skin:    General: Skin is warm and dry.     Comments: 7mm punctate laceration to left medial inner aspect of foot. No active bleeding or drainage    Neurological:     General: No focal deficit present.     Mental Status: He is alert.     Sensory: Sensation is intact.     Motor: Motor function is intact.     Coordination: Coordination is intact.     Gait: Gait is intact.     Comments: Intact sensation. Ambulatory without difficulty.       ED Treatments / Results  Labs (all labs ordered are listed, but only abnormal results are displayed) Labs Reviewed - No data to display  EKG None  Radiology No results found.  Procedures .Marland KitchenLaceration Repair  Date/Time: 01/24/2019 10:51 AM Performed by: Nettie Elm, PA-C Authorized by: Nettie Elm, PA-C   Consent:    Consent obtained:  Verbal   Consent given by:  Patient   Risks discussed:  Infection, pain, retained foreign body, tendon damage, poor cosmetic result, need for additional repair, nerve damage, poor wound healing and vascular damage   Alternatives discussed:  No treatment, delayed treatment, referral and observation Anesthesia (see MAR for exact dosages):    Anesthesia method:  None Laceration details:    Location:  Foot   Foot location:  L ankle   Length (cm):  0.2   Depth (mm):  1 Repair type:    Repair type:  Simple Pre-procedure details:    Preparation:  Patient was prepped and draped in usual sterile fashion and imaging obtained to evaluate for foreign bodies Exploration:    Hemostasis achieved with:  Direct pressure   Wound exploration: wound explored through full range of motion and entire depth of wound probed and visualized     Wound extent: no foreign bodies/material noted, no muscle damage noted, no nerve damage noted, no tendon damage noted, no underlying fracture noted and no vascular damage noted     Contaminated: no   Treatment:    Area cleansed with:  Betadine   Amount of cleaning:  Standard   Irrigation method:  Pressure wash   Visualized foreign bodies/material removed: no   Skin repair:    Repair method:  Steri-Strips    Number of Steri-Strips:  1 Approximation:    Approximation:  Close Post-procedure details:    Dressing:  Non-adherent dressing   Patient tolerance of procedure:  Tolerated well, no immediate complications   (including critical care time)  Medications Ordered in ED Medications  Tdap (BOOSTRIX) injection 0.5 mL (has no administration in time range)   Initial Impression / Assessment and Plan / ED Course  I have reviewed the triage vital signs and the nursing notes.  Pertinent labs & imaging results that were available during my care of the  patient were reviewed by me and considered in my medical decision making (see chart for details).  48 year old for evaluation of lacerations to left inner aspect of foot.  No active bleeding or drainage.  No overlying skin changes.  Laceration occurred with a metal knife.  Patient reports no foreign body.  I do not palpate one as well.  His tetanus is unknown will update.  Compartment soft.  Neurovascularly intact.  Normal MSK exam.  Laceration does not require suturing however did place steri-strip over wound with dressing.    Wound explored and base of wound visualized in a bloodless field without evidence of foreign body.  Laceration occurred < 8 hours prior to repair which was well tolerated. Tdap updated.  Pt has no comorbidities to effect normal wound healing. Pt discharged  without antibiotics.  Discussed suture home care with patient and answered questions. Pt to follow-up for wound check they are to return to the ED sooner for signs of infection. Pt is hemodynamically stable with no complaints prior to dc.         Final Clinical Impressions(s) / ED Diagnoses   Final diagnoses:  Laceration of left foot, initial encounter  Not up to date with diphtheria-tetanus vaccination    ED Discharge Orders    None       Pius Byrom A, PA-C 01/24/19 1054    Gerhard MunchLockwood, Robert, MD 01/24/19 1531

## 2023-09-04 DEATH — deceased
